# Patient Record
Sex: Female | Born: 1970 | Race: Asian | Hispanic: No | Smoking: Light tobacco smoker
Health system: Southern US, Community
[De-identification: ages and names within clinical notes are randomized; demographics above are authoritative.]

## PROBLEM LIST (undated history)

## (undated) DIAGNOSIS — M549 Dorsalgia, unspecified: Secondary | ICD-10-CM

## (undated) DIAGNOSIS — G8929 Other chronic pain: Secondary | ICD-10-CM

## (undated) DIAGNOSIS — I10 Essential (primary) hypertension: Secondary | ICD-10-CM

## (undated) DIAGNOSIS — G43909 Migraine, unspecified, not intractable, without status migrainosus: Secondary | ICD-10-CM

## (undated) HISTORY — PX: OTHER SURGICAL HISTORY: SHX169

## (undated) HISTORY — DX: Migraine, unspecified, not intractable, without status migrainosus: G43.909

## (undated) HISTORY — DX: Essential (primary) hypertension: I10

---

## 2015-02-07 ENCOUNTER — Encounter: Payer: Self-pay | Admitting: Neurology

## 2015-02-26 ENCOUNTER — Ambulatory Visit: Payer: Self-pay | Admitting: Neurology

## 2015-02-28 ENCOUNTER — Encounter: Payer: Self-pay | Admitting: Neurology

## 2015-02-28 ENCOUNTER — Telehealth: Payer: Self-pay | Admitting: Neurology

## 2015-02-28 NOTE — Telephone Encounter (Signed)
NP appt w/ Dr. Arbutus Leasat no showed 02/26/15. No no show letter mailed to pt. Per Dr. Arbutus Leasat, do not r/s due to no show. Dr. Vear ClockPhillips office notified via fax / Oneita KrasSherri S.

## 2017-11-06 ENCOUNTER — Other Ambulatory Visit: Payer: Self-pay

## 2017-11-06 ENCOUNTER — Observation Stay (HOSPITAL_COMMUNITY)
Admission: EM | Admit: 2017-11-06 | Discharge: 2017-11-08 | Disposition: A | Discharge status: Home / Self Care | Attending: Cardiology | Admitting: Cardiology

## 2017-11-06 ENCOUNTER — Encounter (HOSPITAL_COMMUNITY): Payer: Self-pay | Admitting: Emergency Medicine

## 2017-11-06 ENCOUNTER — Emergency Department (HOSPITAL_COMMUNITY)

## 2017-11-06 DIAGNOSIS — R001 Bradycardia, unspecified: Secondary | ICD-10-CM | POA: Diagnosis present

## 2017-11-06 DIAGNOSIS — Z79891 Long term (current) use of opiate analgesic: Secondary | ICD-10-CM | POA: Diagnosis not present

## 2017-11-06 DIAGNOSIS — M5440 Lumbago with sciatica, unspecified side: Secondary | ICD-10-CM | POA: Diagnosis not present

## 2017-11-06 DIAGNOSIS — G43909 Migraine, unspecified, not intractable, without status migrainosus: Secondary | ICD-10-CM | POA: Diagnosis not present

## 2017-11-06 DIAGNOSIS — R531 Weakness: Secondary | ICD-10-CM | POA: Insufficient documentation

## 2017-11-06 DIAGNOSIS — R42 Dizziness and giddiness: Secondary | ICD-10-CM | POA: Insufficient documentation

## 2017-11-06 DIAGNOSIS — Z91048 Other nonmedicinal substance allergy status: Secondary | ICD-10-CM | POA: Insufficient documentation

## 2017-11-06 DIAGNOSIS — R55 Syncope and collapse: Secondary | ICD-10-CM

## 2017-11-06 DIAGNOSIS — Z791 Long term (current) use of non-steroidal anti-inflammatories (NSAID): Secondary | ICD-10-CM | POA: Diagnosis not present

## 2017-11-06 DIAGNOSIS — M549 Dorsalgia, unspecified: Secondary | ICD-10-CM

## 2017-11-06 DIAGNOSIS — G8929 Other chronic pain: Principal | ICD-10-CM | POA: Insufficient documentation

## 2017-11-06 DIAGNOSIS — I10 Essential (primary) hypertension: Secondary | ICD-10-CM | POA: Insufficient documentation

## 2017-11-06 DIAGNOSIS — Z79899 Other long term (current) drug therapy: Secondary | ICD-10-CM | POA: Diagnosis not present

## 2017-11-06 HISTORY — DX: Other chronic pain: G89.29

## 2017-11-06 HISTORY — DX: Dorsalgia, unspecified: M54.9

## 2017-11-06 LAB — COMPREHENSIVE METABOLIC PANEL
ALK PHOS: 45 U/L (ref 38–126)
ALT: 15 U/L (ref 14–54)
AST: 25 U/L (ref 15–41)
Albumin: 3.6 g/dL (ref 3.5–5.0)
Anion gap: 8 (ref 5–15)
BUN: 19 mg/dL (ref 6–20)
CALCIUM: 8.4 mg/dL — AB (ref 8.9–10.3)
CHLORIDE: 107 mmol/L (ref 101–111)
CO2: 17 mmol/L — ABNORMAL LOW (ref 22–32)
CREATININE: 1.06 mg/dL — AB (ref 0.44–1.00)
Glucose, Bld: 101 mg/dL — ABNORMAL HIGH (ref 65–99)
Potassium: 4.7 mmol/L (ref 3.5–5.1)
Sodium: 132 mmol/L — ABNORMAL LOW (ref 135–145)
Total Bilirubin: 0.9 mg/dL (ref 0.3–1.2)
Total Protein: 6.1 g/dL — ABNORMAL LOW (ref 6.5–8.1)

## 2017-11-06 LAB — MAGNESIUM: MAGNESIUM: 2.1 mg/dL (ref 1.7–2.4)

## 2017-11-06 LAB — CBC
HCT: 41.2 % (ref 36.0–46.0)
HEMATOCRIT: 40.8 % (ref 36.0–46.0)
HEMOGLOBIN: 13.9 g/dL (ref 12.0–15.0)
HEMOGLOBIN: 14.4 g/dL (ref 12.0–15.0)
MCH: 30.3 pg (ref 26.0–34.0)
MCH: 31.5 pg (ref 26.0–34.0)
MCHC: 34.1 g/dL (ref 30.0–36.0)
MCHC: 35 g/dL (ref 30.0–36.0)
MCV: 89.1 fL (ref 78.0–100.0)
MCV: 90.2 fL (ref 78.0–100.0)
PLATELETS: 266 10*3/uL (ref 150–400)
PLATELETS: 217 10*3/uL (ref 150–400)
RBC: 4.58 MIL/uL (ref 3.87–5.11)
RBC: 4.57 MIL/uL (ref 3.87–5.11)
RDW: 12.3 % (ref 11.5–15.5)
RDW: 12.7 % (ref 11.5–15.5)
WBC: 9.2 10*3/uL (ref 4.0–10.5)
WBC: 7.5 10*3/uL (ref 4.0–10.5)

## 2017-11-06 LAB — I-STAT TROPONIN, ED: TROPONIN I, POC: 0.01 ng/mL (ref 0.00–0.08)

## 2017-11-06 LAB — TSH: TSH: 1.664 u[IU]/mL (ref 0.350–4.500)

## 2017-11-06 MED ORDER — OXYCODONE HCL 5 MG PO TABS
5.0000 mg | ORAL_TABLET | ORAL | Status: DC | PRN
  Administered 2017-11-06 – 2017-11-08 (×7): 5 mg via ORAL
  Filled 2017-11-06 (×7): qty 1

## 2017-11-06 MED ORDER — PREGABALIN 25 MG PO CAPS: 75.0000 mg | ORAL_CAPSULE | Freq: Two times a day (BID) | ORAL | Status: DC

## 2017-11-06 MED ORDER — OXYCODONE HCL 5 MG PO TABS
5.0000 mg | ORAL_TABLET | Freq: Two times a day (BID) | ORAL | Status: DC | PRN
  Administered 2017-11-06: 5 mg via ORAL
  Filled 2017-11-06: qty 1

## 2017-11-06 MED ORDER — ENOXAPARIN SODIUM 40 MG/0.4ML ~~LOC~~ SOLN
40.0000 mg | SUBCUTANEOUS | Status: DC
  Administered 2017-11-06: 40 mg via SUBCUTANEOUS
  Filled 2017-11-06: qty 0.4

## 2017-11-06 MED ORDER — ACETAMINOPHEN 325 MG PO TABS
650.0000 mg | ORAL_TABLET | Freq: Four times a day (QID) | ORAL | Status: DC | PRN
  Administered 2017-11-06 – 2017-11-08 (×6): 650 mg via ORAL
  Filled 2017-11-06 (×6): qty 2

## 2017-11-06 MED ORDER — ASPIRIN EC 81 MG PO TBEC
81.0000 mg | DELAYED_RELEASE_TABLET | Freq: Every day | ORAL | Status: DC
  Administered 2017-11-06: 81 mg via ORAL
  Filled 2017-11-06: qty 1

## 2017-11-06 MED ORDER — ALPRAZOLAM 0.5 MG PO TABS
1.0000 mg | ORAL_TABLET | Freq: Every day | ORAL | Status: DC
  Administered 2017-11-06 – 2017-11-07 (×2): 1 mg via ORAL
  Filled 2017-11-06 (×2): qty 2

## 2017-11-06 MED ORDER — SODIUM CHLORIDE 0.9 % IV BOLUS (SEPSIS)
500.0000 mL | Freq: Once | INTRAVENOUS | Status: AC
  Administered 2017-11-06: 500 mL via INTRAVENOUS

## 2017-11-06 MED ORDER — FENTANYL 25 MCG/HR TD PT72
25.0000 ug | MEDICATED_PATCH | TRANSDERMAL | Status: DC
  Administered 2017-11-06: 25 ug via TRANSDERMAL
  Filled 2017-11-06: qty 1

## 2017-11-06 NOTE — ED Notes (Signed)
Cardiology at bedside.

## 2017-11-06 NOTE — ED Notes (Signed)
Vear Clockhillips monitor is reading patients T waves as an extra beat and reading a heart rate in 80's. However on Zoll pt remains sinus brady rate of 40-50.

## 2017-11-06 NOTE — ED Notes (Signed)
Sandwich given 

## 2017-11-06 NOTE — Progress Notes (Signed)
Patient arrived on [address] from Emergency Room, patient c/o severe back pain with radiation down into the left leg which is chronic.  BP is mildly elevated, patient states this is from her back pain.  Patient wearing a Fentanyl patch that was placed in the Emergency Room.  Cardiology fellow notified of patients uncontrolled pain.

## 2017-11-06 NOTE — ED Provider Notes (Signed)
[facility] EMERGENCY [facility] Provider Note   CSN: [id number] Arrival date & time: 11/06/17  1039     History   Chief Complaint Chief Complaint  Patient presents with  . Bradycardia    HPI Lori Hawkins is a 46 y.o. female.  HPI 46 year old female history of chronic pain presents today with generalized weakness and lightheadedness.  She was seen at [facility] urgent care today where she was significantly bradycardic with a blood pressure in the 50s and sent here via EMS.  Currently her heart rate is in the 90s and blood pressure is increased to 120 systolically.  She feels improved.  She has had no known.  She was [facility] at [facility] weeks ago today and was noted to be bradycardic at that time.  However, her blood pressure increased and she was discharged home.  She states that she has not had any chest pain or change in medications.  She is on chronic pain medicine and had her morphine changed to Percocet.  However, she states that she has been on all of these medications for several years.  She also takes Xanax and skeletal muscle relaxant.  She is on birth control pills.  She denies any headache, head injury, chest pain, dyspnea, nausea, or vomiting.  Her last menstrual period was last week and normal. Past Medical History:  Diagnosis Date  . Hypertension   . Migraine     There are no active problems to display for this patient.   No past surgical history on file.  OB History    No data available       Home Medications    Prior to Admission medications   Medication Sig Start Date End Date Taking? Authorizing Provider  ALPRAZolam Prudy Feeler(XANAX) 1 MG tablet Take 1 mg by mouth at bedtime.    [provider]  fentaNYL (DURAGESIC - DOSED MCG/HR) 25 MCG/HR patch Place 25 mcg onto the skin every 3 (three) days.    [provider]  naproxen (NAPROSYN) 500 MG tablet Take 500 mg by mouth 2 (two) times daily with a meal.    [provider]    oxycodone (OXY-IR) 5 MG capsule Take 5 mg by mouth every 12 (twelve) hours as needed.    [provider]  pregabalin (LYRICA) 75 MG capsule Take 75 mg by mouth 2 (two) times daily.    [provider]  tiZANidine (ZANAFLEX) 4 MG capsule Take 6 mg by mouth 3 (three) times daily as needed for muscle spasms.    [provider]  traZODone (DESYREL) 100 MG tablet Take 100 mg by mouth at bedtime.    [provider]    Family History No family history on file.  Social History Social History   Tobacco Use  . Smoking status: Never Smoker  Substance Use Topics  . Alcohol use: Yes    Alcohol/week: 0.0 oz  . Drug use: No     Allergies   Iodine   Review of Systems Review of Systems  All other systems reviewed and are negative.    Physical Exam Updated Vital Signs There were no vitals taken for this visit.  Physical Exam  Constitutional: She is oriented to person, place, and time. She appears well-developed and well-nourished.  HENT:  Head: Normocephalic and atraumatic.  Right Ear: External ear normal.  Left Ear: External ear normal.  Eyes: Conjunctivae and EOM are normal. Pupils are equal, round, and reactive to light.  Neck: Normal range  of motion. Neck supple.  Cardiovascular: Normal rate, regular rhythm, normal heart sounds and intact distal pulses.  Pulmonary/Chest: Effort normal and breath sounds normal.  Abdominal: Soft. Bowel sounds are normal.  Musculoskeletal: Normal range of motion.  Neurological: She is alert and oriented to person, place, and time.  Skin: Skin is warm. Capillary refill takes less than 2 seconds.  Psychiatric: She has a normal mood and affect.  Nursing note and vitals reviewed.    ED Treatments / Results  Labs (all labs ordered are listed, but only abnormal results are displayed) Labs Reviewed  CBC  COMPREHENSIVE METABOLIC PANEL  I-STAT TROPONIN, ED    EKG  EKG Interpretation  Date/Time:  Saturday  November 06 2017 10:51:39 EST Ventricular Rate:  43 PR Interval:    QRS Duration:   QT Interval:    QTC Calculation: 510 R Axis:   71 Text Interpretation:  Sinus bradycardia Abnrm T, consider ischemia, anterolateral lds ST elev, probable normal early repol pattern Confirmed by Margarita Grizzleay, Kiyani Jernigan [phone]([id number]) on 11/06/2017 11:07:13 AM       Radiology Dg Chest Port 1 View  Result Date: 11/06/2017 CLINICAL DATA:  Hypotension, dizziness and bradycardia today. Similar symptoms intermittent over the past 2 weeks. History of hypertension. EXAM: PORTABLE CHEST 1 VIEW COMPARISON:  None. FINDINGS: Heart size is upper normal. Overall cardiomediastinal silhouette is within normal limits. Pacer pads overlie the left heart. Lungs are clear. No pleural effusion or pneumothorax seen. No acute or suspicious osseous finding. IMPRESSION: No active disease.  Lungs are clear. Electronically Signed   By: Bary RichardStan  Maynard M.D.   On: 11/06/2017 11:27    Procedures Procedures (including critical care time)  Medications Ordered in ED Medications  sodium chloride 0.9 % bolus 500 mL (not administered)     Initial Impression / Assessment and Plan / ED Course  I have reviewed the triage vital signs and the nursing notes.  Pertinent labs & imaging results that were available during my care of the patient were reviewed by me and considered in my medical decision making (see chart for details).     During my evaluation patient's heart rate was in the 80s.  However, she had another episode where she dropped into the 40s momentarily.  We will repeat the EKG while she is at a normal heart rate.  Patient's symptoms consistent with her bradycardia.  Plan cardiac consultation. Discussed with Dr. Mayford Knifeurner.  She has seen the patient in consult and will admit.  CRITICAL CARE Performed by: Margarita Grizzleanielle Cresta Riden Total critical care time: 30 minutes Critical care time was exclusive of separately billable procedures and treating other  patients. Critical care was necessary to treat or prevent imminent or life-threatening deterioration. Critical care was time spent personally by me on the following activities: development of treatment plan with patient and/or surrogate as well as nursing, discussions with consultants, evaluation of patient's response to treatment, examination of patient, obtaining history from patient or surrogate, ordering and performing treatments and interventions, ordering and review of laboratory studies, ordering and review of radiographic studies, pulse oximetry and re-evaluation of patient's condition.  Final Clinical Impressions(s) / ED Diagnoses   Final diagnoses:  Symptomatic bradycardia    ED Discharge Orders    None       Margarita Grizzleay, Haruto Demaria, MD 11/06/17 1210

## 2017-11-06 NOTE — H&P (Signed)
Admit date: 11/06/2017 Referring Physician: Dr. Rosalia Hammersay Primary Cardiologist: None Chief complaint/reason for admission:bradycardia and dizziness  HPI: Lori Hawkins is a 46 y.o. female who is being seen today for the evaluation of bradycardia and dizziness at the request of Dr. Margarita Grizzleanielle Ray.  This is a [age] year old female with a history of chronic lower back pain with sciatica on chronic Percocet and Zanaflex.  She also has a history of hypertension and migraine headaches.  She recently has been having episodes of dizziness and presyncope.  She presented to [facility] emergency room with nursing to be in hypotension on 10/23/2017.  Her orthostatic vital signs were positive upon standing.  She was given IV fluids.  After her IV fluid bolus her symptoms resolved.  Her EKG showed sinus bradycardia with no acute changes.  It was felt that the morphine was building up in our system with slight renal insufficiency and causing her symptoms.  She was told to stay off morphine.  She followed up with her pain doctor who switched her to Percocet.  Of note at that time her creatinine was 1.19 and troponin was normal.  Over the past few days she has had a headache and does have a history of migraine headaches in the past but says this headache was different.  This is associated with weakness fatigue and dizziness.  She also describes some discomfort in her bilateral arms that she says feels like inflammation.  She is also apparently had some tingling of her left foot but attributes this to her back problems.  She was seen by her primary care physician this morning and noted a heart rate of 41-50 bpm and was sent to the emergency room for further evaluation.  She currently denies any chest pain or shortness of breath.  On her heart monitor her heart rate is around 40 bpm.  She is not on any SA nodal slowing or blocking agents.  She is though, on Zanaflex which has been known to cause severe bradycardia.  She  has been on this for some time.  She denies any history of thyroid disorder in the past.  She denies any use of drugs.  In ER TSH was normal at 1.664.  Creatinine slightly elevated at 1.06.  Sodium slightly low at 132.  Troponin normal at 0.01.  Chest x-ray showed no active disease.  Cardiology is now asked to evaluate and admit for further evaluation.   PMH:    Past Medical History:  Diagnosis Date  . Chronic back pain    on chronic percocet and Zanaflex  . Hypertension   . Migraine     PSH:   History reviewed. No pertinent surgical history.  ALLERGIES:   Iodine  Prior to Admit Meds:   (Not in a hospital admission) Family HX:    Family History  Problem Relation Age of Onset  . CVA Mother    Social HX:    Social History   Socioeconomic History  . Marital status: Not on file    Spouse name: Not on file  . Number of children: Not on file  . Years of education: Not on file  . Highest education level: Not on file  Social Needs  . Financial resource strain: Not on file  . Food insecurity - worry: Not on file  . Food insecurity - inability: Not on file  . Transportation needs - medical: Not on file  . Transportation needs - non-medical: Not on file  Occupational History  .  Not on file  Tobacco Use  . Smoking status: Light Tobacco Smoker  Substance and Sexual Activity  . Alcohol use: Yes    Alcohol/week: 0.0 oz  . Drug use: No  . Sexual activity: Not on file  Other Topics Concern  . Not on file  Social History Narrative  . Not on file     ROS:  All ROS were addressed and are negative except what is stated in the HPI  PHYSICAL EXAM Vitals:   11/06/17 1215 11/06/17 1245  BP: 129/73 124/84  Pulse: (!) 51 (!) 52  Resp: 17 17  Temp:    SpO2: 100% 100%   General: Well developed, well nourished, in no acute distress Head: Eyes PERRLA, No xanthomas.   Normal cephalic and atramatic  Lungs:   Clear bilaterally to auscultation and percussion. Heart:   HRRR S1 S2 Pulses  are 2+ & equal.            No carotid bruit. No JVD.  No abdominal bruits. No femoral bruits. Abdomen: Bowel sounds are positive, abdomen soft and non-tender without masses or                  Hernia's noted. Msk:  Back normal, normal gait. Normal strength and tone for age. Extremities:   No clubbing, cyanosis or edema.  DP +1 Neuro: Alert and oriented X 3. Psych:  Good affect, responds appropriately   Labs:   Lab Results  Component Value Date   WBC 9.2 11/06/2017   HGB 13.9 11/06/2017   HCT 40.8 11/06/2017   MCV 89.1 11/06/2017   PLT 266 11/06/2017   Recent Labs  Lab 11/06/17 1117  NA 132*  K 4.7  CL 107  CO2 17*  BUN 19  CREATININE 1.06*  CALCIUM 8.4*  PROT 6.1*  BILITOT 0.9  ALKPHOS 45  ALT 15  AST 25  GLUCOSE 101*   No results found for: CKTOTAL, CKMB, CKMBINDEX, TROPONINI No results found for: PTT No results found for: INR, PROTIME  No results found for: CHOL No results found for: HDL No results found for: LDLCALC No results found for: TRIG No results found for: CHOLHDL No results found for: LDLDIRECT    Radiology:  Dg Chest Port 1 View  Result Date: 11/06/2017 CLINICAL DATA:  Hypotension, dizziness and bradycardia today. Similar symptoms intermittent over the past 2 weeks. History of hypertension. EXAM: PORTABLE CHEST 1 VIEW COMPARISON:  None. FINDINGS: Heart size is upper normal. Overall cardiomediastinal silhouette is within normal limits. Pacer pads overlie the left heart. Lungs are clear. No pleural effusion or pneumothorax seen. No acute or suspicious osseous finding. IMPRESSION: No active disease.  Lungs are clear. Electronically Signed   By: Bary RichardStan  Maynard M.D.   On: 11/06/2017 11:27     Telemetry    Sinus bradycardia- Personally Reviewed  ECG    Sinus bradycardia at 43 bpm and prolonged QTC at 510 ms with T wave inversions in V1 through V2.- Personally Reviewed   ASSESSMENT/PLAN:  1.  Dizziness/presyncope-this is likely due to underlying  bradycardia.  There may be a component of dehydration as well as her creatinine is still slightly elevated.  She had an elevated creatinine when she presented to [facility] a week ago and she was orthostatic as well.  Her symptoms of dizziness resolved after IV fluid hydration.  She says the dizziness and presyncope are sporadic. -Will repeat orthostatic blood pressures -Zanaflex has  been associated with hypotension and bradycardia so  will stop. -Gentle IV fluid hydration -TSH was normal  2.  Bradycardia-this appears symptomatic with dizziness and presyncope that is episodic in nature.  She is not on any primary sinus or AV nodal blocking agents.  She is though on Zanaflex which can cause severe bradycardia.  She has been on this for some time though.  Her TSH is normal. -We will admit for 23-hour observation -Stop Zanaflex and monitor on telemetry  3.  Prolonged QTC with a QT interval of 510 ms with biphasic ST-T wave abnormality in V1 through V3. -Her electrolytes are normal except for mildly low Na at 132.  Normal K+.   -will continue to monitor - there is no family history of SCD   Armanda Magic, MD  11/06/2017  1:45 PM

## 2017-11-06 NOTE — ED Notes (Addendum)
Pt asking for another fentanyl patch because her back pain is increasing.  esplanation given  No fentanyl patch ahanged out for  72 hours and the last nosxcodone and fentanyl was given at 1330  Not time for either

## 2017-11-06 NOTE — ED Triage Notes (Signed)
Pt arrives by [facility] from [facility] office for low heart rate and hypotension. Pt states that since Wednesday she has been feeling very tired weak and dizzy at times. This morning when she got up she felt so weak that she fell to the floor. On arrival to [facility] pt had heart rate of 40 with bp in 70's. [facility] gave 500 CC NS. Pt arrives to ed with heart rate ranging from mid 40's to 80's sinus. BP 100/50. Zoll applied to patient. Pt alert and ox4. following all commands, GCS 15. Pt had similar event 2 weeks ago [facility] Symptoms had resolved until this wednesday.

## 2017-11-06 NOTE — ED Notes (Signed)
Attempted report unsuccessful  rns phone ringing back to the desk  Will call back

## 2017-11-06 NOTE — ED Notes (Signed)
Pt up to the bedside commode  She want to go to the br unable with all her hook-ups

## 2017-11-06 NOTE — ED Notes (Signed)
Report given to rn on 3e 

## 2017-11-07 DIAGNOSIS — R42 Dizziness and giddiness: Secondary | ICD-10-CM | POA: Diagnosis not present

## 2017-11-07 DIAGNOSIS — R9431 Abnormal electrocardiogram [ECG] [EKG]: Secondary | ICD-10-CM | POA: Diagnosis not present

## 2017-11-07 DIAGNOSIS — R001 Bradycardia, unspecified: Secondary | ICD-10-CM | POA: Diagnosis not present

## 2017-11-07 LAB — BASIC METABOLIC PANEL
Anion gap: 8 (ref 5–15)
BUN: 11 mg/dL (ref 6–20)
CALCIUM: 8.9 mg/dL (ref 8.9–10.3)
CO2: 23 mmol/L (ref 22–32)
CREATININE: 0.86 mg/dL (ref 0.44–1.00)
Chloride: 106 mmol/L (ref 101–111)
GFR calc non Af Amer: >60 mL/min (ref >60)
Glucose, Bld: 98 mg/dL (ref 65–99)
Potassium: 3.5 mmol/L (ref 3.5–5.1)
SODIUM: 137 mmol/L (ref 135–145)

## 2017-11-07 LAB — HIV ANTIBODY (ROUTINE TESTING W REFLEX): HIV Screen 4th Generation wRfx: NONREACTIVE

## 2017-11-07 MED ORDER — LORATADINE 10 MG PO TABS
10.0000 mg | ORAL_TABLET | Freq: Every day | ORAL | Status: DC
Start: 2017-11-07 — End: 2017-11-08
  Administered 2017-11-07 – 2017-11-08 (×2): 10 mg via ORAL
  Filled 2017-11-07 (×2): qty 1

## 2017-11-07 MED ORDER — OXYCODONE HCL 5 MG PO TABS
5.0000 mg | ORAL_TABLET | Freq: Once | ORAL | Status: AC
  Administered 2017-11-07: 5 mg via ORAL
  Filled 2017-11-07: qty 1

## 2017-11-07 MED ORDER — HYDRALAZINE HCL 25 MG PO TABS
25.0000 mg | ORAL_TABLET | Freq: Once | ORAL | Status: AC
  Administered 2017-11-07: 25 mg via ORAL
  Filled 2017-11-07: qty 1

## 2017-11-07 NOTE — Progress Notes (Signed)
Pt is currently having a migraine with running nose, gave tylenol, also asked for a sleep aid cardiologist guard on more med's d/t prolonged QT. will monitor QTC  Will stick to regular medication.

## 2017-11-07 NOTE — Progress Notes (Signed)
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

## 2017-11-07 NOTE — Plan of Care (Signed)
  Progressing Health Behavior/Discharge Planning: Ability to manage health-related needs will improve 11/07/2017 0448 - Progressing by Myna BrightSmith, Tresia Revolorio Y, RN Clinical Measurements: Ability to maintain clinical measurements within normal limits will improve 11/07/2017 0448 - Progressing by Myna BrightSmith, Chiffon Kittleson Y, RN Will remain free from infection 11/07/2017 0448 - Progressing by Myna BrightSmith, Raequon Catanzaro Y, RN Diagnostic test results will improve 11/07/2017 0448 - Progressing by Myna BrightSmith, Azelea Seguin Y, RN Respiratory complications will improve 11/07/2017 0448 - Progressing by Myna BrightSmith, Demiah Gullickson Y, RN Cardiovascular complication will be avoided 11/07/2017 0448 - Progressing by Myna BrightSmith, Patton Rabinovich Y, RN

## 2017-11-07 NOTE — Progress Notes (Signed)
Pt blood pressure 179/92@0215 .  Down to 82/48 lying @0645 .  Pt sitting on side of bed 0700 bp 97/68.  Up all night states she is sleepy.  On call MD notified.  On coming nurse made aware will continue to monitor.

## 2017-11-07 NOTE — Progress Notes (Signed)
Pt wanting leave at 1700, RN explained she still needed 2d echo. Pt states she just wants to leave and she has things to do. NP paged.

## 2017-11-07 NOTE — Progress Notes (Signed)
Pt decided to not leave AMA, requesting something for allergies.  MD paged.

## 2017-11-07 NOTE — Progress Notes (Signed)
Progress Note  Patient Name: Lori Hawkins Date of Encounter: 11/07/2017  Primary Cardiologist: new/Dr Mayford Knifeurner  Subjective   She feels tired, but no dizziness.  Inpatient Medications    Scheduled Meds: . ALPRAZolam  1 mg Oral QHS  . enoxaparin (LOVENOX) injection  40 mg Subcutaneous Q24H   Continuous Infusions:  PRN Meds: acetaminophen, oxyCODONE   Vital Signs    Vitals:   11/07/17 0215 11/07/17 0415 11/07/17 0645 11/07/17 0700  BP: (!) 179/92  (!) 82/48 97/68  Pulse:   71   Resp:   15   Temp:   98.7 F (37.1 C)   TempSrc:   Oral   SpO2:   99%   Weight:  137 lb 12.8 oz (62.5 kg)    Height:        Intake/Output Summary (Last 24 hours) at 11/07/2017 0842 Last data filed at 11/07/2017 0035 Gross per 24 hour  Intake 500 ml  Output 400 ml  Net 100 ml   Filed Weights   11/06/17 1750 11/07/17 0415  Weight: 139 lb 15.9 oz (63.5 kg) 137 lb 12.8 oz (62.5 kg)    Telemetry    SB to ST - Personally Reviewed  ECG    SB, early repolarization, prolonged QT, today's pending - Personally Reviewed  Physical Exam   GEN: No acute distress.   Neck: No JVD Cardiac: RRR, no murmurs, rubs, or gallops.  Respiratory: Clear to auscultation bilaterally. GI: Soft, nontender, non-distended  MS: No edema; No deformity. Neuro:  Nonfocal  Psych: Normal affect   Labs    Chemistry Recent Labs  Lab 11/06/17 1117 11/07/17 0511  NA 132* 137  K 4.7 3.5  CL 107 106  CO2 17* 23  GLUCOSE 101* 98  BUN 19 11  CREATININE 1.06* 0.86  CALCIUM 8.4* 8.9  PROT 6.1*  --   ALBUMIN 3.6  --   AST 25  --   ALT 15  --   ALKPHOS 45  --   BILITOT 0.9  --   GFRNONAA >60 >60  GFRAA >60 >60  ANIONGAP 8 8     Hematology Recent Labs  Lab 11/06/17 1117 11/06/17 1315  WBC 9.2 7.5  RBC 4.58 4.57  HGB 13.9 14.4  HCT 40.8 41.2  MCV 89.1 90.2  MCH 30.3 31.5  MCHC 34.1 35.0  RDW 12.3 12.7  PLT 266 217    Cardiac EnzymesNo results for input(s): TROPONINI in the last 168  hours.  Recent Labs  Lab 11/06/17 1124  TROPIPOC 0.01     BNPNo results for input(s): BNP, PROBNP in the last 168 hours.   DDimer No results for input(s): DDIMER in the last 168 hours.   Radiology    Dg Chest Port 1 View  Result Date: 11/06/2017 CLINICAL DATA:  Hypotension, dizziness and bradycardia today. Similar symptoms intermittent over the past 2 weeks. History of hypertension. EXAM: PORTABLE CHEST 1 VIEW COMPARISON:  None. FINDINGS: Heart size is upper normal. Overall cardiomediastinal silhouette is within normal limits. Pacer pads overlie the left heart. Lungs are clear. No pleural effusion or pneumothorax seen. No acute or suspicious osseous finding. IMPRESSION: No active disease.  Lungs are clear. Electronically Signed   By: Bary RichardStan  Maynard M.D.   On: 11/06/2017 11:27    Cardiac Studies   TTE pending  Patient Profile     46 y.o. female with dizziness and bradycardia  Assessment & Plan    1.  Dizziness/presyncope-this is likely due to underlying bradycardia  and hypotension.  She had an elevated creatinine when she presented to [facility] a week ago and she was orthostatic as well.  Her symptoms of dizziness resolved after IV fluid hydration. However she was on long term Zanaflex: Per uptodate it can cause: Cardiovascular: Hypotension (16% to 33%) Central nervous system: Drowsiness (48% to 92%), dizziness (16% to 45%) Cardiovascular: Bradycardia (2% to 10%)  -Gentle IV fluid hydration -TSH was normal - echo is pending  2.  Bradycardia-this appears symptomatic with dizziness and presyncope that is episodic in nature.  She is not on any primary sinus or AV nodal blocking agents.  She is though on Zanaflex which can cause severe bradycardia.  She has been on this for some time though.  Her TSH is normal. -We will admit for 23-hour observation -Stop Zanaflex and monitor on telemetry  3.  Prolonged QTC with a QT interval of 510 ms with biphasic ST-T wave abnormality  in V1 through V3. -Her electrolytes are normal except for mildly low Na at 132.  Normal K+.   -will continue to monitor - there is no family history of SCD  - her Heart rates now 80-110, we will repeat ECG and recheck QT interval  Potential discharge later today.  For questions or updates, please contact [facility] Please consult [email or web address] for contact info under Cardiology/STEMI.      Signed, Tobias AlexanderKatarina Runell Kovich, MD  11/07/2017, 8:42 AM

## 2017-11-08 ENCOUNTER — Observation Stay (HOSPITAL_BASED_OUTPATIENT_CLINIC_OR_DEPARTMENT_OTHER)

## 2017-11-08 ENCOUNTER — Observation Stay (HOSPITAL_COMMUNITY): Payer: TRICARE For Life (TFL)

## 2017-11-08 DIAGNOSIS — R001 Bradycardia, unspecified: Secondary | ICD-10-CM | POA: Diagnosis not present

## 2017-11-08 DIAGNOSIS — R42 Dizziness and giddiness: Secondary | ICD-10-CM | POA: Diagnosis not present

## 2017-11-08 DIAGNOSIS — R55 Syncope and collapse: Secondary | ICD-10-CM | POA: Diagnosis not present

## 2017-11-08 DIAGNOSIS — M5440 Lumbago with sciatica, unspecified side: Secondary | ICD-10-CM | POA: Diagnosis not present

## 2017-11-08 DIAGNOSIS — G8929 Other chronic pain: Secondary | ICD-10-CM | POA: Diagnosis not present

## 2017-11-08 LAB — ECHOCARDIOGRAM COMPLETE
CHL CUP DOP CALC LVOT VTI: 16.4 cm
E/e' ratio: 4.89
EWDT: 174 ms
FS: 35 % (ref 28–44)
Height: 64 in
IVS/LV PW RATIO, ED: 1.02
LA ID, A-P, ES: 32 mm
LA diam index: 1.93 cm/m2
LA vol A4C: 33.8 ml
LA vol index: 24.8 mL/m2
LA vol: 41.2 mL
LDCA: 2.84 cm2
LEFT ATRIUM END SYS DIAM: 32 mm
LV E/e' medial: 4.89
LV E/e'average: 4.89
LV PW d: 13 mm — AB (ref 0.6–1.1)
LV TDI E'LATERAL: 12.6
LV TDI E'MEDIAL: 5.22
LVELAT: 12.6 cm/s
LVOTD: 19 mm
LVOTPV: 96.1 cm/s
LVOTSV: 47 mL
MV Dec: 174
MV pk A vel: 106 m/s
MV pk E vel: 61.6 m/s
RV LATERAL S' VELOCITY: 13.2 cm/s
RV TAPSE: 20 mm
Weight: 2184 oz

## 2017-11-08 MED ORDER — ONDANSETRON HCL 4 MG/2ML IJ SOLN
4.0000 mg | Freq: Four times a day (QID) | INTRAMUSCULAR | Status: DC
  Administered 2017-11-08: 4 mg via INTRAVENOUS
  Filled 2017-11-08: qty 2

## 2017-11-08 NOTE — Discharge Summary (Signed)
Discharge Summary    Patient ID: Lori SpineKathryn Hawkins,  MRN: [id number], DOB/AGE: 45-Feb-1972 45 y.o.  Admit date: 11/06/2017 Discharge date: 11/08/2017  Primary Care Provider: Willow OraAndy, Camille L Primary Cardiologist: Mayford Knifeurner  Discharge Diagnoses    Active Problems:   Bradycardia   Allergies Allergies  Allergen Reactions  . Iodine     Diagnostic Studies/Procedures    TTE: 11/18  Study Conclusions  - Left ventricle: The cavity size was normal. Systolic function was   normal. The estimated ejection fraction was in the range of 55%   to 60%. Wall motion was normal; there were no regional wall   motion abnormalities. Left ventricular diastolic function   parameters were normal. _____________   History of Present Illness     Lori Hawkins is a 46 y.o. female who presented with bradycardia and dizziness.  She has a history of chronic lower back pain with sciatica on chronic Percocet and Zanaflex.  She also has a history of hypertension and migraine headaches.  She recently has been having episodes of dizziness and presyncope.  [facility] with nursing to be in hypotension on 10/23/2017.  Her orthostatic vital signs were positive upon standing.  She was given IV fluids.  After her IV fluid bolus her symptoms resolved.  Her EKG showed sinus bradycardia with no acute changes.  It was felt that the morphine was building up in our system with slight renal insufficiency and causing her symptoms.  She was told to stay off morphine.  She followed up with her pain doctor who switched her to Percocet.  Of note at that time her creatinine was 1.19 and troponin was normal.  Over the past few days prior to admission she had a headache and does have a history of migraine headaches in the past but says this headache was different.  This was associated with weakness fatigue and dizziness.  She also described some discomfort in her bilateral arms that she  says feels like inflammation.  She has also apparently had some tingling of her left foot but attributed this to her back problems.  She was seen by her primary care physician that morning and noted a heart rate of 41-50 bpm and was sent to the emergency room for further evaluation.  She denied any chest pain or shortness of breath.  On her heart monitor her heart rate was around 40 bpm.  She was not on any SA nodal slowing or blocking agents.  She was on Zanaflex which has been known to cause severe bradycardia.  She has been on this for some time.  She denied any history of thyroid disorder in the past.  She denies any use of drugs.  In ER TSH was normal at 1.664.  Creatinine slightly elevated at 1.06.  Sodium slightly low at 132.  Troponin normal at 0.01.  Chest x-ray showed no active disease.  Cardiology was asked to evaluate and admit for further evaluation.  Hospital Course     She was admitted and given gentle IVF for hydration. QT interval was noted to be prolonged at 510 with overall normal electrolytes with mildly low Na+.  HR did improve into the 80s. Did have some n/v that improved with zofran. QTC interval improved on EKG to 475. Echo showed normal EF with no WMA. TSH was normal.   Lori Hawkins was seen by Dr. Clifton JamesMcAlhany and determined stable for discharge home. Follow up in the office has been  arranged. Medications are listed below.   _____________  Discharge Vitals Blood pressure (!) 186/90, pulse 86, temperature 98.9 F (37.2 C), temperature source Oral, resp. rate 18, height 5\' 4"  (1.626 m), weight 136 lb 8 oz (61.9 kg), last menstrual period 10/28/2017, SpO2 98 %.  Filed Weights   11/06/17 1750 11/07/17 0415 11/08/17 0544  Weight: 139 lb 15.9 oz (63.5 kg) 137 lb 12.8 oz (62.5 kg) 136 lb 8 oz (61.9 kg)    Labs & Radiologic Studies    CBC Recent Labs    11/06/17 1117 11/06/17 1315  WBC 9.2 7.5  HGB 13.9 14.4  HCT 40.8 41.2  MCV 89.1 90.2  PLT 266 217   Basic  Metabolic Panel Recent Labs    95/28/4111/17/18 1117 11/06/17 1553 11/07/17 0511  NA 132*  --  137  K 4.7  --  3.5  CL 107  --  106  CO2 17*  --  23  GLUCOSE 101*  --  98  BUN 19  --  11  CREATININE 1.06*  --  0.86  CALCIUM 8.4*  --  8.9  MG  --  2.1  --    Liver Function Tests Recent Labs    11/06/17 1117  AST 25  ALT 15  ALKPHOS 45  BILITOT 0.9  PROT 6.1*  ALBUMIN 3.6   No results for input(s): LIPASE, AMYLASE in the last 72 hours. Cardiac Enzymes No results for input(s): CKTOTAL, CKMB, CKMBINDEX, TROPONINI in the last 72 hours. BNP Invalid input(s): POCBNP D-Dimer No results for input(s): DDIMER in the last 72 hours. Hemoglobin A1C No results for input(s): HGBA1C in the last 72 hours. Fasting Lipid Panel No results for input(s): CHOL, HDL, LDLCALC, TRIG, CHOLHDL, LDLDIRECT in the last 72 hours. Thyroid Function Tests Recent Labs    11/06/17 1201  TSH 1.664   _____________  Dg Chest Port 1 View  Result Date: 11/06/2017 CLINICAL DATA:  Hypotension, dizziness and bradycardia today. Similar symptoms intermittent over the past 2 weeks. History of hypertension. EXAM: PORTABLE CHEST 1 VIEW COMPARISON:  None. FINDINGS: Heart size is upper normal. Overall cardiomediastinal silhouette is within normal limits. Pacer pads overlie the left heart. Lungs are clear. No pleural effusion or pneumothorax seen. No acute or suspicious osseous finding. IMPRESSION: No active disease.  Lungs are clear. Electronically Signed   By: Bary RichardStan  Maynard M.D.   On: 11/06/2017 11:27   Disposition   Pt is being discharged home today in good condition.  Follow-up Plans & Appointments    Follow-up Information    Quintella Reicherturner, Traci R, MD Follow up.   Specialty:  Cardiology Why:  The office will call you for a follow up appt.  Contact information: [address] [place] [address] [phone]          Discharge Instructions    Diet - low sodium heart healthy   Complete by:  As  directed    Increase activity slowly   Complete by:  As directed       Discharge Medications     Medication List    STOP taking these medications   tiZANidine 4 MG capsule Commonly known as:  ZANAFLEX     TAKE these medications   acetaminophen 325 MG tablet Commonly known as:  TYLENOL Take 650 mg every 6 (six) hours as needed by mouth for mild pain.   ALPRAZolam 1 MG tablet Commonly known as:  XANAX Take 1 mg by mouth at bedtime.  ibuprofen 800 MG tablet Commonly known as:  ADVIL,MOTRIN Take 800 mg every 8 (eight) hours as needed by mouth for mild pain.   oxyCODONE-acetaminophen 5-325 MG tablet Commonly known as:  PERCOCET/ROXICET Take 1 tablet every 6 (six) hours as needed by mouth for pain.   traZODone 100 MG tablet Commonly known as:  DESYREL Take 100 mg by mouth at bedtime.   TRINESSA (28) PO Take 1 tablet daily by mouth.        Outstanding Labs/Studies   N/a   Duration of Discharge Encounter   Greater than 30 minutes including physician time.  Signed, Laverda Page NP-C 11/08/2017, 2:54 PM

## 2017-11-08 NOTE — Progress Notes (Signed)
Lillia AbedLindsay, NP returned call and stated she and cardiologist will be rounding.  I updated patient on plan of care and she stated nausea passed after she had emesis episode.

## 2017-11-08 NOTE — Progress Notes (Signed)
Reviewed discharge instructions/medications with patient. Answered her questions. Patient is stable and ready for discharge.  She is not feeling nauseated anymore. Note was provided for work.

## 2017-11-08 NOTE — Progress Notes (Signed)
Patient c/o nausea and vomited in trash can.  Unable to measure amount of emesis.  Laverda PageLindsay Roberts, NP with Team C sent text page requesting prn med for nausea and vomiting.  Patient kept updated on medication request.

## 2017-11-08 NOTE — Progress Notes (Signed)
Progress Note  Patient Name: Lori Hawkins Date of Encounter: 11/08/2017  Primary Cardiologist: Dr Lori Hawkins  Subjective   Nausea this am with vomiting. NO chest pain or dyspnea.   Inpatient Medications    Scheduled Meds: . ALPRAZolam  1 mg Oral QHS  . enoxaparin (LOVENOX) injection  40 mg Subcutaneous Q24H  . loratadine  10 mg Oral Daily   Continuous Infusions:  PRN Meds: acetaminophen, oxyCODONE   Vital Signs    Vitals:   11/07/17 0700 11/07/17 1118 11/07/17 2056 11/08/17 0544  BP: 97/68 131/67 (!) 176/92 (!) 186/90  Pulse:  71 95 86  Resp:  18 18 18   Temp:  98.7 F (37.1 C) 99.2 F (37.3 C) 98.9 F (37.2 C)  TempSrc:   Oral Oral  SpO2:   97% 98%  Weight:    136 lb 8 oz (61.9 kg)  Height:        Intake/Output Summary (Last 24 hours) at 11/08/2017 1231 Last data filed at 11/08/2017 0830 Gross per 24 hour  Intake 480 ml  Output 0 ml  Net 480 ml   Filed Weights   11/06/17 1750 11/07/17 0415 11/08/17 0544  Weight: 139 lb 15.9 oz (63.5 kg) 137 lb 12.8 oz (62.5 kg) 136 lb 8 oz (61.9 kg)    Telemetry     Sinus tach, no HR below 60 documented- Personally Reviewed  ECG     NSR- Personally Reviewed  Physical Exam    General: Well developed, well nourished, NAD  HEENT: OP clear, mucus membranes moist  SKIN: warm, dry. No rashes. Neuro: No focal deficits  Musculoskeletal: Muscle strength 5/5 all ext  Psychiatric: Mood and affect normal  Neck: No JVD, no carotid bruits, no thyromegaly, no lymphadenopathy.  Lungs:Clear bilaterally, no wheezes, rhonci, crackles Cardiovascular: Regular rhythm, tachy. No murmurs, gallops or rubs. Abdomen:Soft. Bowel sounds present. Non-tender.  Extremities: No lower extremity edema. Pulses are 2 + in the bilateral DP/PT.   Labs    Chemistry Recent Labs  Lab 11/06/17 1117 11/07/17 0511  NA 132* 137  K 4.7 3.5  CL 107 106  CO2 17* 23  GLUCOSE 101* 98  BUN 19 11  CREATININE 1.06* 0.86  CALCIUM 8.4* 8.9    PROT 6.1*  --   ALBUMIN 3.6  --   AST 25  --   ALT 15  --   ALKPHOS 45  --   BILITOT 0.9  --   GFRNONAA >60 >60  GFRAA >60 >60  ANIONGAP 8 8     Hematology Recent Labs  Lab 11/06/17 1117 11/06/17 1315  WBC 9.2 7.5  RBC 4.58 4.57  HGB 13.9 14.4  HCT 40.8 41.2  MCV 89.1 90.2  MCH 30.3 31.5  MCHC 34.1 35.0  RDW 12.3 12.7  PLT 266 217    Cardiac EnzymesNo results for input(s): TROPONINI in the last 168 hours.  Recent Labs  Lab 11/06/17 1124  TROPIPOC 0.01     BNPNo results for input(s): BNP, PROBNP in the last 168 hours.   DDimer No results for input(s): DDIMER in the last 168 hours.   Radiology    No results found.  Cardiac Studies   TTE pending  Patient Profile     46 y.o. female with dizziness and bradycardia on no AV nodal blocking agents, with recent use of Zanaflex which can cause bradycardia.   Assessment & Plan    1.  Dizziness/presyncope: Resolved but now nauseous. This feels like her typical migraines. Unclear  etiology of her dizziness. EKG with QTC 475 msec. Echo is pending to assess LVEF.   2.  Bradycardia: Unclear etiology. Zanaflex can cause bradycardia and has been held. She is no longer bradycardic. TSH is normal.   Will follow up on echo. Zofran for nausea. If her nausea improves and echo is ok, may be able to discharge today.   For questions or updates, please contact [facility] Please consult [email or web address] for contact info under Cardiology/STEMI.      Signed, Lori Carrowhristopher Leili Eskenazi, MD  11/08/2017, 12:31 PM

## 2017-11-08 NOTE — Progress Notes (Signed)
Patient stated she fell last week when she felt dizzy and her heart rate dropped.  Patient is SR this morning.  She is alert and oriented.  Her fall risk score is 10.  She declined bed alarm.  I requested to her that when she sits up before standing, to call for RN or NT is she feels dizzy and same if she feels dizzy when she stands.  She stated understanding.

## 2017-11-08 NOTE — Progress Notes (Signed)
1st dose IV Zofran given for nausea and vomiting.  Nausea returned.

## 2017-11-09 LAB — T4: T4 TOTAL: 6.7 ug/dL (ref 4.5–12.0)

## 2017-11-09 LAB — T3 UPTAKE: T3 UPTAKE RATIO: 26 % (ref 24–39)

## 2017-11-09 LAB — T3, FREE: T3, Free: 3 pg/mL (ref 2.0–4.4)

## 2017-11-16 ENCOUNTER — Telehealth: Payer: Self-pay

## 2017-11-16 NOTE — Telephone Encounter (Signed)
Unable to reach patient to review lab results. Letter sent to patient.

## 2017-11-16 NOTE — Progress Notes (Signed)
Result letter sent to patient

## 2017-12-16 ENCOUNTER — Encounter: Payer: Self-pay | Admitting: Cardiology

## 2018-08-13 IMAGING — DX DG CHEST 1V PORT
1 series · 1 of 1 positions shown · non-contrast
Comparison: None.

CLINICAL DATA: Hypotension, dizziness and bradycardia today.
Similar symptoms intermittent over the past 2 weeks. History of
hypertension.

EXAM:
PORTABLE CHEST 1 VIEW

[chest ap]
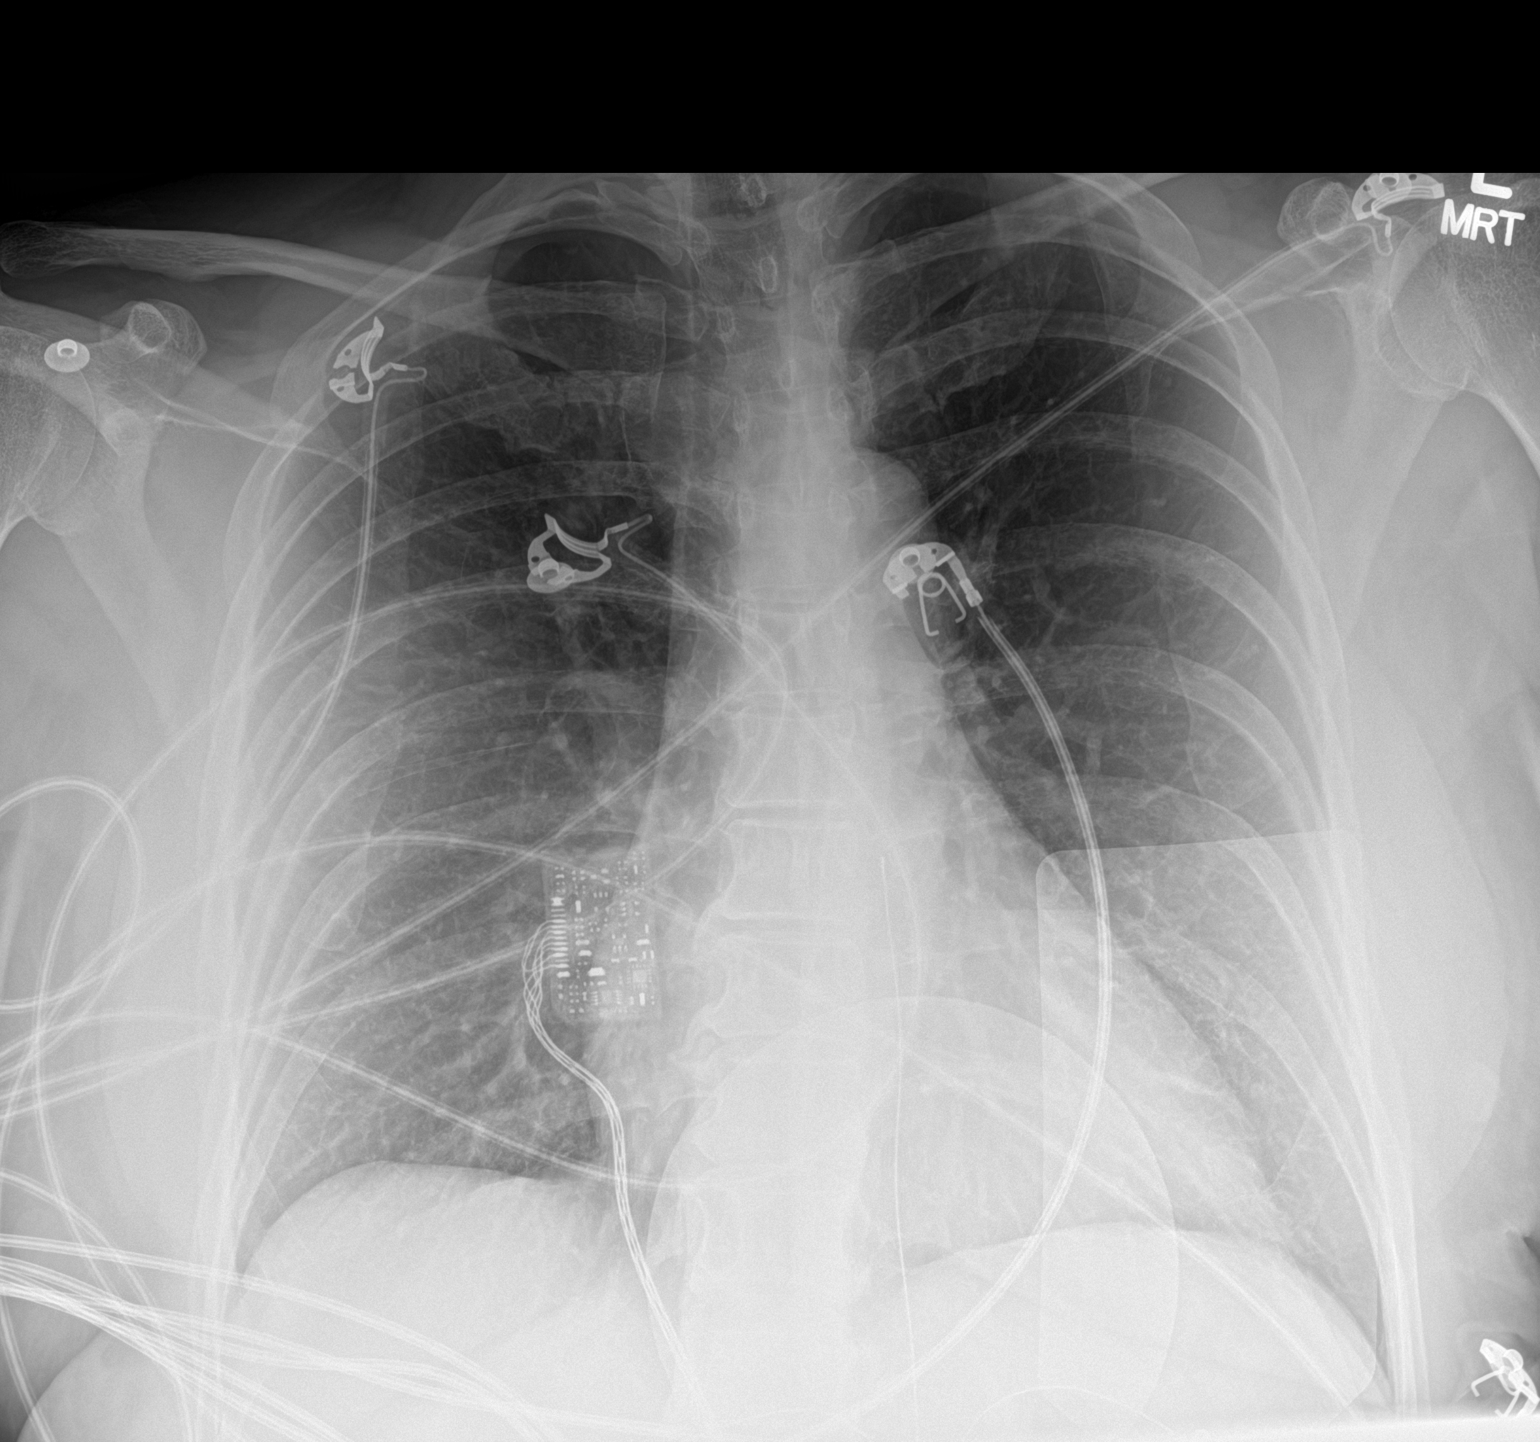

[1 of 1 positions shown; findings below may reference images not displayed]

FINDINGS: Heart size is upper normal. Overall cardiomediastinal silhouette is
within normal limits. Pacer pads overlie the left heart.

Lungs are clear. No pleural effusion or pneumothorax seen. No acute
or suspicious osseous finding.
IMPRESSION: No active disease.  Lungs are clear.

## 2019-01-12 ENCOUNTER — Ambulatory Visit
Admission: RE | Admit: 2019-01-12 | Discharge: 2019-01-12 | Disposition: A | Discharge status: Home / Self Care | Source: Ambulatory Visit | Attending: Internal Medicine | Admitting: Internal Medicine

## 2019-01-12 ENCOUNTER — Other Ambulatory Visit: Payer: Self-pay | Admitting: Internal Medicine

## 2019-01-12 DIAGNOSIS — M5442 Lumbago with sciatica, left side: Secondary | ICD-10-CM

## 2021-05-26 ENCOUNTER — Ambulatory Visit: Payer: Self-pay | Admitting: Family Medicine

## 2021-05-26 DIAGNOSIS — Z0289 Encounter for other administrative examinations: Secondary | ICD-10-CM

## 2021-06-18 ENCOUNTER — Encounter: Payer: Self-pay | Admitting: Internal Medicine
